# Patient Record
Sex: Female | Born: 1965 | Race: White | Hispanic: No | Marital: Married | Smoking: Former smoker
Health system: Southern US, Community
[De-identification: ages and names within clinical notes are randomized; demographics above are authoritative.]

## PROBLEM LIST (undated history)

## (undated) DIAGNOSIS — K5090 Crohn's disease, unspecified, without complications: Secondary | ICD-10-CM

## (undated) DIAGNOSIS — G43909 Migraine, unspecified, not intractable, without status migrainosus: Secondary | ICD-10-CM

## (undated) HISTORY — PX: BOWEL RESECTION: SHX1257

---

## 2017-05-03 ENCOUNTER — Other Ambulatory Visit: Payer: Self-pay | Admitting: Orthopedic Surgery

## 2017-05-03 DIAGNOSIS — M25512 Pain in left shoulder: Secondary | ICD-10-CM

## 2017-05-07 ENCOUNTER — Ambulatory Visit: Payer: Self-pay

## 2017-05-12 ENCOUNTER — Encounter: Payer: Self-pay | Admitting: Radiology

## 2017-05-12 ENCOUNTER — Ambulatory Visit
Admission: RE | Admit: 2017-05-12 | Discharge: 2017-05-12 | Disposition: A | Discharge status: Home / Self Care | Payer: BC Managed Care – PPO | Source: Ambulatory Visit | Attending: Orthopedic Surgery | Admitting: Orthopedic Surgery

## 2017-05-12 DIAGNOSIS — S4292XA Fracture of left shoulder girdle, part unspecified, initial encounter for closed fracture: Secondary | ICD-10-CM | POA: Diagnosis not present

## 2017-05-12 DIAGNOSIS — S46812A Strain of other muscles, fascia and tendons at shoulder and upper arm level, left arm, initial encounter: Secondary | ICD-10-CM | POA: Insufficient documentation

## 2017-05-12 DIAGNOSIS — R600 Localized edema: Secondary | ICD-10-CM | POA: Insufficient documentation

## 2017-05-12 DIAGNOSIS — M25512 Pain in left shoulder: Secondary | ICD-10-CM | POA: Insufficient documentation

## 2017-05-12 IMAGING — MR MR SHOULDER*L* W/O CM
5 series · 40 of 40 positions shown · non-contrast
Comparison: None.

CLINICAL DATA: Left shoulder pain.  Unspecified chronicity.

EXAM:
MRI OF THE LEFT SHOULDER WITHOUT CONTRAST
TECHNIQUE: Multiplanar, multisequence MR imaging of the shoulder was performed.
No intravenous contrast was administered.

[Series 3: T2 fat-sat · axial · 4.0mm · 0.59mm/px · z∈[-21,+61]mm · 8 of 20 slices shown (1 of 3)]
[im 1/20]
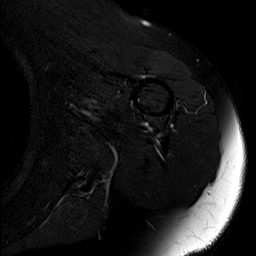
[im 3/20]
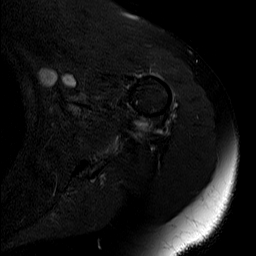
[im 6/20]
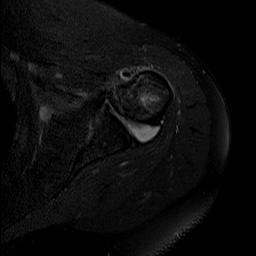
[im 9/20]
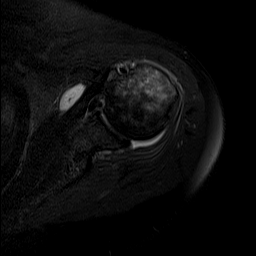
[im 11/20]
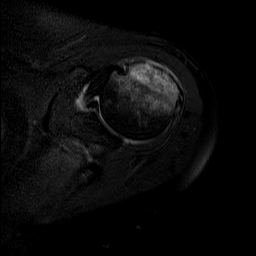
[im 14/20]
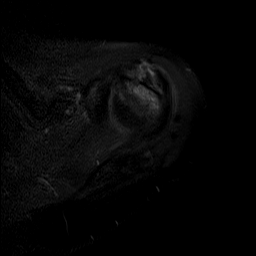
[im 17/20]
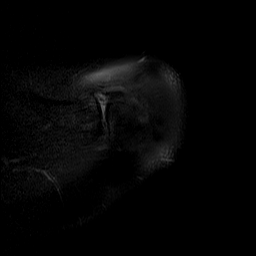
[im 20/20]
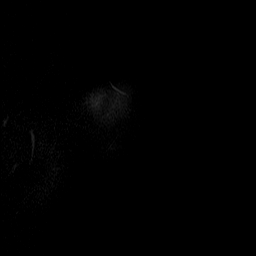

[Series 4: T2 fat-sat · coronal · 4.0mm · 0.59mm/px · 8 of 18 slices shown (2 of 3)]
[im 1/18]
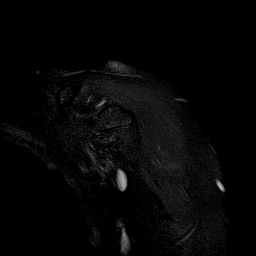
[im 3/18]
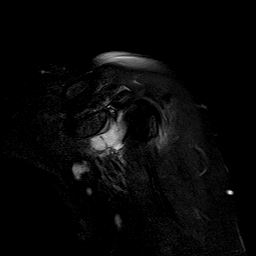
[im 5/18]
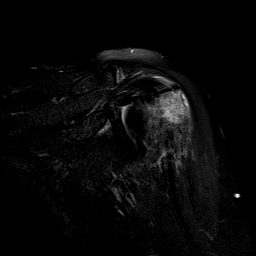
[im 8/18]
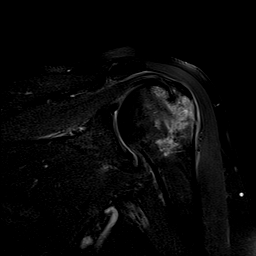
[im 10/18]
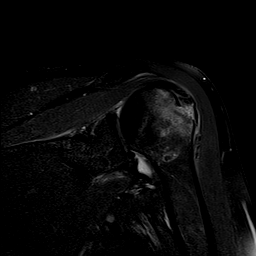
[im 13/18]
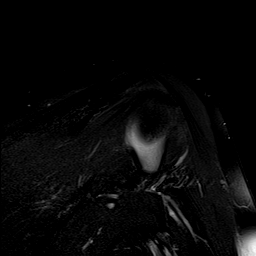
[im 15/18]
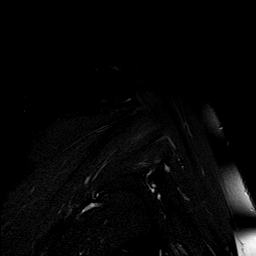
[im 18/18]
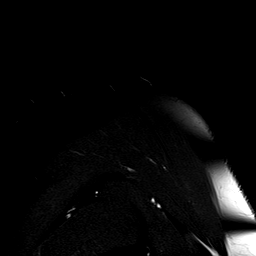

[Series 5: PD · coronal · 4.0mm · 0.59mm/px · 8 of 18 slices shown]
[im 1/18]
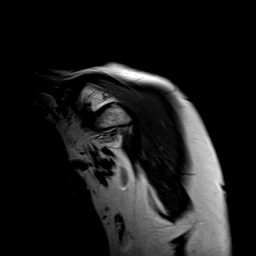
[im 3/18]
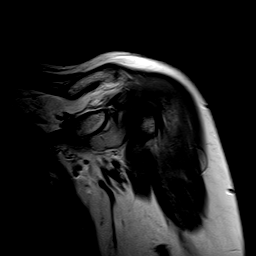
[im 5/18]
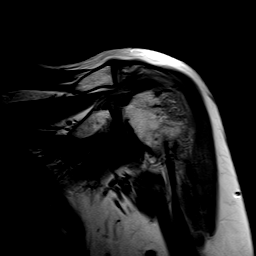
[im 8/18]
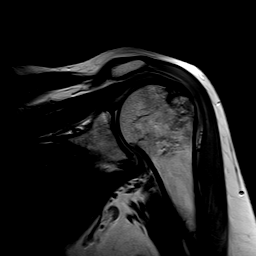
[im 10/18]
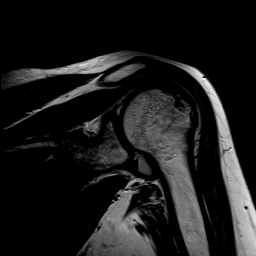
[im 13/18]
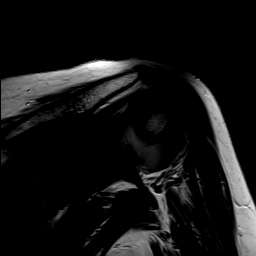
[im 15/18]
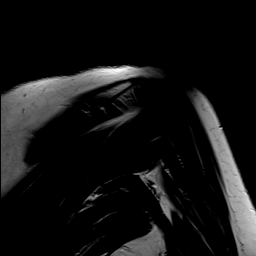
[im 18/18]
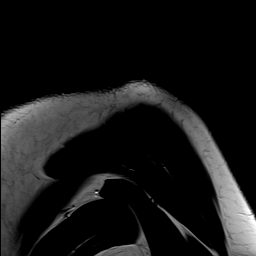

[Series 6: T1 · sagittal · 4.0mm · 0.59mm/px · 8 of 20 slices shown]
[im 1/20]
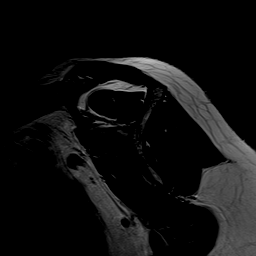
[im 3/20]
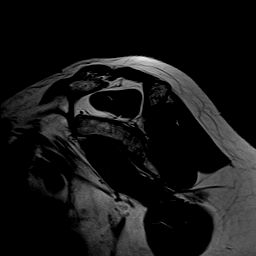
[im 6/20]
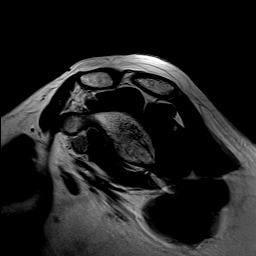
[im 9/20]
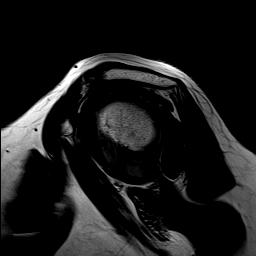
[im 11/20]
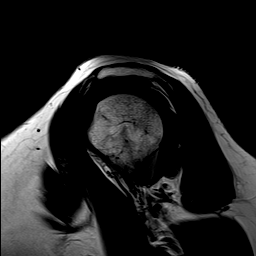
[im 14/20]
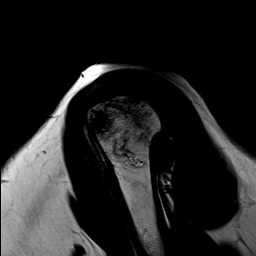
[im 17/20]
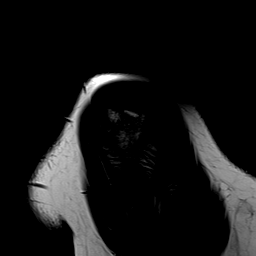
[im 20/20]
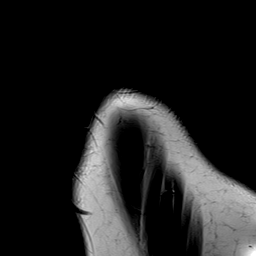

[Series 7: T2 fat-sat · sagittal · 4.0mm · 0.59mm/px · 8 of 20 slices shown (3 of 3)]
[im 1/20]
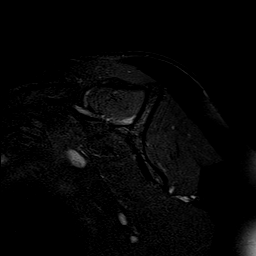
[im 3/20]
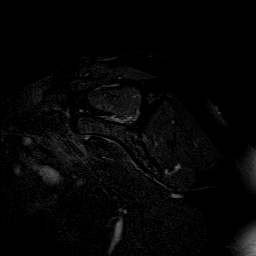
[im 6/20]
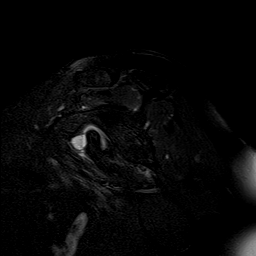
[im 9/20]
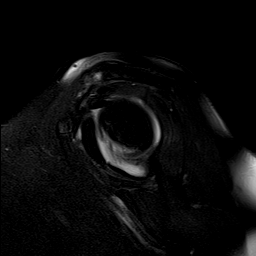
[im 11/20]
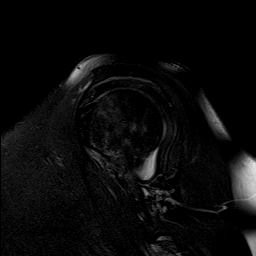
[im 14/20]
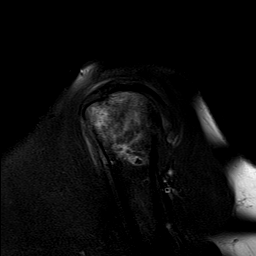
[im 17/20]
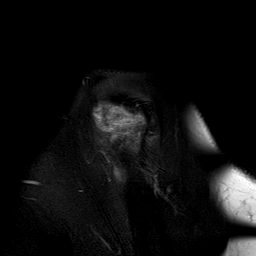
[im 20/20]
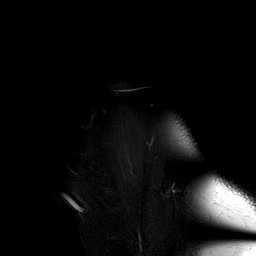

[40 of 40 positions shown; findings below may reference images not displayed]

FINDINGS: Rotator cuff: Mild tendinosis of the supraspinatus tendon with a
small partial-thickness bursal surface tear. Infraspinatus tendon is
intact. Teres minor tendon is intact. Subscapularis tendon is
intact.

Muscles: No atrophy or abnormal signal of the muscles of the rotator
cuff.

Biceps long head:  Intact.

Acromioclavicular Joint: Mild arthropathy of the acromioclavicular
joint. Type II acromion. No subacromial/subdeltoid bursal fluid.

Glenohumeral Joint: No joint effusion. No chondral defect.

Labrum: Grossly intact, but evaluation is limited by lack of
intraarticular fluid.

Bones: Severe edema of the greater tuberosity with cortical
irregularity most consistent with a nondisplaced fracture. No
aggressive osseous lesion.

Other: None.
IMPRESSION: 1. Acute nondisplaced fracture of the greater tuberosity with severe
surrounding marrow edema.
2. Mild tendinosis of the supraspinatus tendon with a small
partial-thickness bursal surface tear.

## 2018-03-14 ENCOUNTER — Encounter: Payer: Self-pay | Admitting: Emergency Medicine

## 2018-03-14 ENCOUNTER — Ambulatory Visit
Admission: EM | Admit: 2018-03-14 | Discharge: 2018-03-14 | Disposition: A | Discharge status: Home / Self Care | Payer: BC Managed Care – PPO | Attending: Family Medicine | Admitting: Family Medicine

## 2018-03-14 ENCOUNTER — Other Ambulatory Visit: Payer: Self-pay

## 2018-03-14 DIAGNOSIS — J069 Acute upper respiratory infection, unspecified: Secondary | ICD-10-CM

## 2018-03-14 HISTORY — DX: Migraine, unspecified, not intractable, without status migrainosus: G43.909

## 2018-03-14 HISTORY — DX: Crohn's disease, unspecified, without complications: K50.90

## 2018-03-14 LAB — RAPID STREP SCREEN (MED CTR MEBANE ONLY): Streptococcus, Group A Screen (Direct): NEGATIVE

## 2018-03-14 MED ORDER — AZITHROMYCIN 250 MG PO TABS: 250.0000 mg | ORAL_TABLET | Freq: Every day | ORAL | 0 refills | Status: AC

## 2018-03-14 MED ORDER — BENZONATATE 200 MG PO CAPS: ORAL_CAPSULE | ORAL | 0 refills | Status: AC

## 2018-03-14 NOTE — Discharge Instructions (Addendum)
Use Flonase nasal spray as directed daily for the next 2 to 3 weeks.  Use the azithromycin in 5 to 7 days if you are not improving otherwise do not have the prescription filled.

## 2018-03-14 NOTE — ED Provider Notes (Signed)
[facility]    CSN: [id number] Arrival date & time: 03/14/18  1700     History   Chief Complaint Chief Complaint  Patient presents with  . Sinus Problem    APPT    HPI Cheryl LennoxJennifer Ray is a 53 y.o. female.   HPI  53 year old female presents with a sinus pressure and a cough that she has had for 5 days.  She has had no fever or chills.  That the is usually progressed to a severe sinus infection which she has had several in the past.  Been trying over-the-counter Tylenol Dimetapp without relief.  Off is usually when she first lies down at night and first thing in the morning she has uncontrollable coughing.  She states that the [employer] have worked well for her in the previous episodes.        Past Medical History:  Diagnosis Date  . Crohn disease (HCC)   . Migraine     There are no active problems to display for this patient.   Past Surgical History:  Procedure Laterality Date  . BOWEL RESECTION      OB History   No obstetric history on file.      Home Medications    Prior to Admission medications   Medication Sig Start Date End Date Taking? Authorizing Provider  albuterol (PROVENTIL HFA;VENTOLIN HFA) 108 (90 Base) MCG/ACT inhaler 2 puffs q.i.d. p.r.n. short of breath, wheezing, or cough 08/10/16  Yes [provider]  celecoxib (CELEBREX) 200 MG capsule Take by mouth. 12/14/17  Yes [provider]  cyanocobalamin (,VITAMIN B-12,) 1000 MCG/ML injection Inject into the muscle. 12/14/17  Yes [provider]  folic acid (FOLVITE) 1 MG tablet Take by mouth. 12/14/17  Yes [provider]  mesalamine (PENTASA) 500 MG CR capsule Take by mouth. 12/25/14  Yes [provider]  Vitamin D, Ergocalciferol, (DRISDOL) 1.25 MG (50000 UT) CAPS capsule Take by mouth. 12/29/16  Yes [provider]  azithromycin (ZITHROMAX) 250 MG tablet Take 1 tablet (250 mg total) by mouth daily. Take first 2 tablets together,  then 1 every day until finished. 03/14/18   Lutricia Feiloemer, Kennedy Bohanon P, PA-C  benzonatate (TESSALON) 200 MG capsule Take one cap TID PRN cough 03/14/18   Lutricia Feiloemer, Cola Highfill P, PA-C    Family History Family History  Problem Relation Age of Onset  . Hyperlipidemia Mother   . Hyperlipidemia Father     Social History Social History   Tobacco Use  . Smoking status: Former Smoker    Last attempt to quit: 03/14/2008    Years since quitting: 10.0  . Smokeless tobacco: Never Used  Substance Use Topics  . Alcohol use: Never    Frequency: Never  . Drug use: Never     Allergies   Ciprofloxacin and Sulfa antibiotics   Review of Systems Review of Systems  Constitutional: Positive for activity change. Negative for appetite change, chills, fatigue and fever.  HENT: Positive for congestion, postnasal drip, rhinorrhea, sinus pressure, sinus pain and sore throat.   Respiratory: Positive for cough.   All other systems reviewed and are negative.    Physical Exam Triage Vital Signs ED Triage Vitals  Enc Vitals Group     BP 03/14/18 1731 119/84     Pulse Rate 03/14/18 1731 (!) 112     Resp 03/14/18 1731 16     Temp 03/14/18 1731 98.2 F (36.8 C)     Temp Source 03/14/18 1731 Oral  SpO2 03/14/18 1731 99 %     Weight 03/14/18 1732 155 lb (70.3 kg)     Height 03/14/18 1732 5\' 7"  (1.702 m)     Head Circumference --      Peak Flow --      Pain Score 03/14/18 1731 7     Pain Loc --      Pain Edu? --      Excl. in GC? --    No data found.  Updated Vital Signs BP 119/84 (BP Location: Left Arm)   Pulse (!) 112   Temp 98.2 F (36.8 C) (Oral)   Resp 16   Ht 5\' 7"  (1.702 m)   Wt 155 lb (70.3 kg)   SpO2 99%   BMI 24.28 kg/m   Visual Acuity Right Eye Distance:   Left Eye Distance:   Bilateral Distance:    Right Eye Near:   Left Eye Near:    Bilateral Near:     Physical Exam Vitals signs and nursing note reviewed.  Constitutional:      General: She is not in acute distress.     Appearance: Normal appearance. She is normal weight. She is not ill-appearing, toxic-appearing or diaphoretic.  HENT:     Head: Normocephalic.     Comments: Patient has tenderness percussion over the frontal sinuses more so than the maxillary.    Right Ear: Tympanic membrane and ear canal normal.     Left Ear: Tympanic membrane and ear canal normal.     Nose: Nose normal. No congestion or rhinorrhea.     Mouth/Throat:     Mouth: Mucous membranes are moist.     Pharynx: No oropharyngeal exudate or posterior oropharyngeal erythema.  Eyes:     General:        Right eye: No discharge.        Left eye: No discharge.     Conjunctiva/sclera: Conjunctivae normal.  Neck:     Musculoskeletal: Normal range of motion and neck supple.  Pulmonary:     Effort: Pulmonary effort is normal.     Breath sounds: Normal breath sounds.  Musculoskeletal: Normal range of motion.  Lymphadenopathy:     Cervical: No cervical adenopathy.  Skin:    General: Skin is warm and dry.  Neurological:     General: No focal deficit present.     Mental Status: She is alert and oriented to person, place, and time.  Psychiatric:        Mood and Affect: Mood normal.        Behavior: Behavior normal.        Thought Content: Thought content normal.        Judgment: Judgment normal.      UC Treatments / Results  Labs (all labs ordered are listed, but only abnormal results are displayed) Labs Reviewed  RAPID STREP SCREEN ([facility] ONLY)    EKG None  Radiology No results found.  Procedures Procedures (including critical care time)  Medications Ordered in UC Medications - No data to display  Initial Impression / Assessment and Plan / UC Course  I have reviewed the triage vital signs and the nursing notes.  Pertinent labs & imaging results that were available during my care of the patient were reviewed by me and considered in my medical decision making (see chart for details).    The patient  has a  upper respiratory infection.  At this point time I do not believe that antibiotics are  indicated and is most likely viral.  However she is  requesting a prescription for antibiotics in case it does progressed to sinusitis.  Told her I will provide her with a wait-and-see prescription.  I have recommended Augmentin or doxycycline but she states with her Crohn's disease she does not like to take something that she is likelyto have GI symptoms and  would prefer Z-Pak.  Addition I have instructed her in the use of Flonase and that she needs to take it for 2 to 3 weeks continuously.  I will provide her with Jerilynn Som since they give her great relief of her coughing.  She is not improving she should follow-up with her primary care physician  Final Clinical Impressions(s) / UC Diagnoses   Final diagnoses:  Acute upper respiratory infection     Discharge Instructions     Use Flonase nasal spray as directed daily for the next 2 to 3 weeks.  Use the azithromycin in 5 to 7 days if you are not improving otherwise do not have the prescription filled.   ED Prescriptions    Medication Sig Dispense Auth. Provider   benzonatate (TESSALON) 200 MG capsule Take one cap TID PRN cough 30 capsule Ovid Curd P, PA-C   azithromycin (ZITHROMAX) 250 MG tablet Take 1 tablet (250 mg total) by mouth daily. Take first 2 tablets together, then 1 every day until finished. 6 tablet Lutricia Feil, PA-C     Controlled Substance Prescriptions [place] Controlled Substance Registry consulted? Not Applicable   Lutricia Feil, PA-C 03/14/18 1810

## 2018-03-14 NOTE — ED Triage Notes (Signed)
Patient in today c/o sinus pressure and cough x 5 days. Patient denies fever. Patient has tried OTC Tylenol, decongestant and Dimetapp without relief.

## 2018-03-14 NOTE — ED Triage Notes (Signed)
Patient also states she has a sore throat.

## 2018-03-17 LAB — CULTURE, GROUP A STREP (THRC)

## 2018-04-07 ENCOUNTER — Ambulatory Visit: Payer: Self-pay | Admitting: Family Medicine

## 2020-12-23 ENCOUNTER — Other Ambulatory Visit: Payer: Self-pay

## 2020-12-24 ENCOUNTER — Other Ambulatory Visit: Payer: Self-pay | Admitting: Student in an Organized Health Care Education/Training Program

## 2020-12-24 DIAGNOSIS — K50012 Crohn's disease of small intestine with intestinal obstruction: Secondary | ICD-10-CM

## 2020-12-26 ENCOUNTER — Other Ambulatory Visit: Payer: Self-pay | Admitting: Student in an Organized Health Care Education/Training Program

## 2020-12-26 DIAGNOSIS — K50012 Crohn's disease of small intestine with intestinal obstruction: Secondary | ICD-10-CM

## 2021-01-26 ENCOUNTER — Other Ambulatory Visit: Payer: Self-pay

## 2021-01-26 ENCOUNTER — Ambulatory Visit
Admission: RE | Admit: 2021-01-26 | Discharge: 2021-01-26 | Disposition: A | Discharge status: Home / Self Care | Payer: BC Managed Care – PPO | Source: Ambulatory Visit | Attending: Student in an Organized Health Care Education/Training Program | Admitting: Student in an Organized Health Care Education/Training Program

## 2021-01-26 DIAGNOSIS — K50012 Crohn's disease of small intestine with intestinal obstruction: Secondary | ICD-10-CM | POA: Insufficient documentation

## 2021-01-26 MED ORDER — IOHEXOL 300 MG/ML  SOLN
100.0000 mL | Freq: Once | INTRAMUSCULAR | Status: AC | PRN
  Administered 2021-01-26: 100 mL via INTRAVENOUS

## 2021-07-31 ENCOUNTER — Other Ambulatory Visit: Payer: Self-pay | Admitting: Nurse Practitioner

## 2021-07-31 DIAGNOSIS — Z1231 Encounter for screening mammogram for malignant neoplasm of breast: Secondary | ICD-10-CM

## 2021-09-21 ENCOUNTER — Ambulatory Visit
Admission: RE | Admit: 2021-09-21 | Discharge: 2021-09-21 | Disposition: A | Discharge status: Home / Self Care | Payer: BC Managed Care – PPO | Source: Ambulatory Visit | Attending: Nurse Practitioner | Admitting: Nurse Practitioner

## 2021-09-21 DIAGNOSIS — Z1231 Encounter for screening mammogram for malignant neoplasm of breast: Secondary | ICD-10-CM | POA: Insufficient documentation

## 2021-09-25 ENCOUNTER — Other Ambulatory Visit: Payer: Self-pay | Admitting: Nurse Practitioner

## 2021-09-25 DIAGNOSIS — R928 Other abnormal and inconclusive findings on diagnostic imaging of breast: Secondary | ICD-10-CM

## 2021-09-25 DIAGNOSIS — R921 Mammographic calcification found on diagnostic imaging of breast: Secondary | ICD-10-CM

## 2021-10-13 ENCOUNTER — Ambulatory Visit
Admission: RE | Admit: 2021-10-13 | Discharge: 2021-10-13 | Disposition: A | Discharge status: Home / Self Care | Payer: BC Managed Care – PPO | Source: Ambulatory Visit | Attending: Nurse Practitioner | Admitting: Nurse Practitioner

## 2021-10-13 DIAGNOSIS — R928 Other abnormal and inconclusive findings on diagnostic imaging of breast: Secondary | ICD-10-CM | POA: Insufficient documentation

## 2021-10-13 DIAGNOSIS — R921 Mammographic calcification found on diagnostic imaging of breast: Secondary | ICD-10-CM | POA: Insufficient documentation

## 2021-10-14 ENCOUNTER — Other Ambulatory Visit: Payer: Self-pay | Admitting: Nurse Practitioner

## 2021-10-14 DIAGNOSIS — R921 Mammographic calcification found on diagnostic imaging of breast: Secondary | ICD-10-CM

## 2021-10-14 DIAGNOSIS — R928 Other abnormal and inconclusive findings on diagnostic imaging of breast: Secondary | ICD-10-CM

## 2021-10-21 ENCOUNTER — Inpatient Hospital Stay: Admission: RE | Admit: 2021-10-21 | Payer: BC Managed Care – PPO | Source: Ambulatory Visit

## 2021-11-11 ENCOUNTER — Inpatient Hospital Stay: Admission: RE | Admit: 2021-11-11 | Payer: BC Managed Care – PPO | Source: Ambulatory Visit

## 2021-12-23 ENCOUNTER — Ambulatory Visit
Admission: RE | Admit: 2021-12-23 | Discharge: 2021-12-23 | Disposition: A | Discharge status: Home / Self Care | Payer: BC Managed Care – PPO | Source: Ambulatory Visit | Attending: Nurse Practitioner | Admitting: Nurse Practitioner

## 2021-12-23 DIAGNOSIS — R928 Other abnormal and inconclusive findings on diagnostic imaging of breast: Secondary | ICD-10-CM

## 2021-12-23 DIAGNOSIS — R921 Mammographic calcification found on diagnostic imaging of breast: Secondary | ICD-10-CM | POA: Insufficient documentation

## 2021-12-25 LAB — SURGICAL PATHOLOGY
